# Patient Record
Sex: Male | Born: 1995 | Race: Black or African American | Hispanic: No | Marital: Single | Smoking: Current every day smoker
Health system: Southern US, Community
[De-identification: ages and names within clinical notes are randomized; demographics above are authoritative.]

## PROBLEM LIST (undated history)

## (undated) DIAGNOSIS — E119 Type 2 diabetes mellitus without complications: Secondary | ICD-10-CM

## (undated) HISTORY — PX: WISDOM TOOTH EXTRACTION: SHX21

---
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
## 2015-04-09 ENCOUNTER — Encounter (HOSPITAL_COMMUNITY): Payer: Self-pay | Admitting: Emergency Medicine

## 2015-04-09 ENCOUNTER — Emergency Department (HOSPITAL_COMMUNITY)
Admission: EM | Admit: 2015-04-09 | Discharge: 2015-04-09 | Disposition: A | Discharge status: Home / Self Care | Payer: Medicaid Other | Attending: Emergency Medicine | Admitting: Emergency Medicine

## 2015-04-09 DIAGNOSIS — X19XXXA Contact with other heat and hot substances, initial encounter: Secondary | ICD-10-CM | POA: Insufficient documentation

## 2015-04-09 DIAGNOSIS — Y93G9 Activity, other involving cooking and grilling: Secondary | ICD-10-CM | POA: Insufficient documentation

## 2015-04-09 DIAGNOSIS — Y92009 Unspecified place in unspecified non-institutional (private) residence as the place of occurrence of the external cause: Secondary | ICD-10-CM | POA: Insufficient documentation

## 2015-04-09 DIAGNOSIS — Y998 Other external cause status: Secondary | ICD-10-CM | POA: Insufficient documentation

## 2015-04-09 DIAGNOSIS — Z720 Tobacco use: Secondary | ICD-10-CM | POA: Insufficient documentation

## 2015-04-09 DIAGNOSIS — T300 Burn of unspecified body region, unspecified degree: Secondary | ICD-10-CM

## 2015-04-09 DIAGNOSIS — T22232A Burn of second degree of left upper arm, initial encounter: Secondary | ICD-10-CM | POA: Insufficient documentation

## 2015-04-09 MED ORDER — BACITRACIN ZINC 500 UNIT/GM EX OINT: 1.0000 "application " | TOPICAL_OINTMENT | Freq: Two times a day (BID) | CUTANEOUS | Status: DC

## 2015-04-09 MED ORDER — DOUBLE ANTIBIOTIC 500-10000 UNIT/GM EX OINT
TOPICAL_OINTMENT | Freq: Two times a day (BID) | CUTANEOUS | Status: DC
  Filled 2015-04-09 (×16): qty 1

## 2015-04-09 NOTE — Discharge Instructions (Signed)
Burn Care °Your skin is a natural barrier to infection. It is the largest organ of your body. Burns damage this natural protection. To help prevent infection, it is very important to follow your caregiver's instructions in the care of your burn. °Burns are classified as: °· First degree. There is only redness of the skin (erythema). No scarring is expected. °· Second degree. There is blistering of the skin. Scarring may occur with deeper burns. °· Third degree. All layers of the skin are injured, and scarring is expected. °HOME CARE INSTRUCTIONS  °· Wash your hands well before changing your bandage. °· Change your bandage as often as directed by your caregiver. °¨ Remove the old bandage. If the bandage sticks, you may soak it off with cool, clean water. °¨ Cleanse the burn thoroughly but gently with mild soap and water. °¨ Pat the area dry with a clean, dry cloth. °¨ Apply a thin layer of antibacterial cream to the burn. °¨ Apply a clean bandage as instructed by your caregiver. °¨ Keep the bandage as clean and dry as possible. °· Elevate the affected area for the first 24 hours, then as instructed by your caregiver. °· Only take over-the-counter or prescription medicines for pain, discomfort, or fever as directed by your caregiver. °SEEK IMMEDIATE MEDICAL CARE IF:  °· You develop excessive pain. °· You develop redness, tenderness, swelling, or red streaks near the burn. °· The burned area develops yellowish-white fluid (pus) or a bad smell. °· You have a fever. °MAKE SURE YOU:  °· Understand these instructions. °· Will watch your condition. °· Will get help right away if you are not doing well or get worse. °  °This information is not intended to replace advice given to you by your health care provider. Make sure you discuss any questions you have with your health care provider. °  °Document Released: 05/31/2005 Document Revised: 08/23/2011 Document Reviewed: 10/21/2010 °Elsevier Interactive Patient Education ©2016  [employer] ° °
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
## 2017-10-21 ENCOUNTER — Other Ambulatory Visit: Payer: Self-pay

## 2017-10-21 ENCOUNTER — Emergency Department (HOSPITAL_COMMUNITY): Payer: Self-pay

## 2017-10-21 ENCOUNTER — Encounter (HOSPITAL_COMMUNITY): Payer: Self-pay | Admitting: *Deleted

## 2017-10-21 ENCOUNTER — Emergency Department (HOSPITAL_COMMUNITY)
Admission: EM | Admit: 2017-10-21 | Discharge: 2017-10-21 | Disposition: A | Discharge status: Home / Self Care | Payer: Self-pay | Attending: Emergency Medicine | Admitting: Emergency Medicine

## 2017-10-21 DIAGNOSIS — Z23 Encounter for immunization: Secondary | ICD-10-CM | POA: Insufficient documentation

## 2017-10-21 DIAGNOSIS — F17290 Nicotine dependence, other tobacco product, uncomplicated: Secondary | ICD-10-CM | POA: Insufficient documentation

## 2017-10-21 DIAGNOSIS — S61212A Laceration without foreign body of right middle finger without damage to nail, initial encounter: Secondary | ICD-10-CM | POA: Insufficient documentation

## 2017-10-21 DIAGNOSIS — Y939 Activity, unspecified: Secondary | ICD-10-CM | POA: Insufficient documentation

## 2017-10-21 DIAGNOSIS — Y999 Unspecified external cause status: Secondary | ICD-10-CM | POA: Insufficient documentation

## 2017-10-21 DIAGNOSIS — Y929 Unspecified place or not applicable: Secondary | ICD-10-CM | POA: Insufficient documentation

## 2017-10-21 DIAGNOSIS — W25XXXA Contact with sharp glass, initial encounter: Secondary | ICD-10-CM | POA: Insufficient documentation

## 2017-10-21 MED ORDER — LIDOCAINE HCL (PF) 1 % IJ SOLN
5.0000 mL | Freq: Once | INTRAMUSCULAR | Status: AC
  Administered 2017-10-21: 5 mL
  Filled 2017-10-21: qty 5

## 2017-10-21 MED ORDER — STERILE WATER FOR IRRIGATION IR SOLN
Freq: Once | Status: AC
  Administered 2017-10-21: 1000 mL

## 2017-10-21 MED ORDER — TETANUS-DIPHTH-ACELL PERTUSSIS 5-2.5-18.5 LF-MCG/0.5 IM SUSP
0.5000 mL | Freq: Once | INTRAMUSCULAR | Status: AC
  Administered 2017-10-21: 0.5 mL via INTRAMUSCULAR
  Filled 2017-10-21: qty 0.5
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
## 2017-10-21 NOTE — ED Notes (Signed)
Patient transported to X-ray 

## 2017-10-21 NOTE — Discharge Instructions (Signed)
Your laceration was repaired with 3 sutures today. You were given a tetanus shot. Your x-rays were negative for any bony injuries or opaque foreign bodies.   Your sutures need to come out within 7-10 days.   The original dressing should be left in place for 24 hours.  If your laceration is small enough, you can remove original dressing after 24 hours after which laceration can be opened to air. Laceration can then be gently cleaned with mild soap and water after 24 hours of laceration repair to prevent crusting over the suture knots. An antibiotic ointment can be applied to the wound as well, twice daily until suture removal.   Your sutures are nonabsorbable sutures and you may shower or wash the wound with soap and water without risking increased rates of infection or disruption of the wound.  Although not well studied, prolonged soaking of nonabsorbable stitches including swimming in chlorinated water should be avoided because of the theoretical risk of premature loss of suture tensile strength with wound dehiscence. Patients with sutures should also not swim in natural bodies of water because of a potential increased risk of infection.

## 2018-12-19 ENCOUNTER — Other Ambulatory Visit: Payer: Self-pay

## 2018-12-19 ENCOUNTER — Emergency Department (HOSPITAL_COMMUNITY)
Admission: EM | Admit: 2018-12-19 | Discharge: 2018-12-19 | Disposition: A | Discharge status: Home / Self Care | Payer: Self-pay | Attending: Emergency Medicine | Admitting: Emergency Medicine

## 2018-12-19 ENCOUNTER — Encounter (HOSPITAL_COMMUNITY): Payer: Self-pay | Admitting: Emergency Medicine

## 2018-12-19 DIAGNOSIS — F17210 Nicotine dependence, cigarettes, uncomplicated: Secondary | ICD-10-CM | POA: Insufficient documentation

## 2018-12-19 DIAGNOSIS — Y939 Activity, unspecified: Secondary | ICD-10-CM | POA: Insufficient documentation

## 2018-12-19 DIAGNOSIS — S058X1A Other injuries of right eye and orbit, initial encounter: Secondary | ICD-10-CM | POA: Insufficient documentation

## 2018-12-19 DIAGNOSIS — W25XXXA Contact with sharp glass, initial encounter: Secondary | ICD-10-CM | POA: Insufficient documentation

## 2018-12-19 DIAGNOSIS — T1591XA Foreign body on external eye, part unspecified, right eye, initial encounter: Secondary | ICD-10-CM | POA: Insufficient documentation

## 2018-12-19 DIAGNOSIS — S0502XA Injury of conjunctiva and corneal abrasion without foreign body, left eye, initial encounter: Secondary | ICD-10-CM

## 2018-12-19 DIAGNOSIS — Y999 Unspecified external cause status: Secondary | ICD-10-CM | POA: Insufficient documentation

## 2018-12-19 DIAGNOSIS — Y929 Unspecified place or not applicable: Secondary | ICD-10-CM | POA: Insufficient documentation

## 2018-12-19 MED ORDER — TETRACAINE HCL 0.5 % OP SOLN
1.0000 [drp] | Freq: Once | OPHTHALMIC | Status: AC
  Administered 2018-12-19: 2 [drp] via OPHTHALMIC
  Filled 2018-12-19: qty 4

## 2018-12-19 MED ORDER — KETOROLAC TROMETHAMINE 30 MG/ML IJ SOLN
30.0000 mg | Freq: Once | INTRAMUSCULAR | Status: AC
  Administered 2018-12-19: 30 mg via INTRAMUSCULAR
  Filled 2018-12-19: qty 1

## 2018-12-19 MED ORDER — OXYCODONE-ACETAMINOPHEN 5-325 MG PO TABS
1.0000 | ORAL_TABLET | Freq: Once | ORAL | Status: AC
  Administered 2018-12-19: 1 via ORAL
  Filled 2018-12-19: qty 1

## 2018-12-19 MED ORDER — METOCLOPRAMIDE HCL 10 MG PO TABS
5.0000 mg | ORAL_TABLET | Freq: Once | ORAL | Status: AC
  Administered 2018-12-19: 5 mg via ORAL
  Filled 2018-12-19: qty 1

## 2018-12-19 MED ORDER — FLUORESCEIN SODIUM 1 MG OP STRP
1.0000 | ORAL_STRIP | Freq: Once | OPHTHALMIC | Status: AC
  Administered 2018-12-19: 1 via OPHTHALMIC
  Filled 2018-12-19: qty 1

## 2018-12-19 MED ORDER — ERYTHROMYCIN 5 MG/GM OP OINT
1.0000 "application " | TOPICAL_OINTMENT | Freq: Four times a day (QID) | OPHTHALMIC | Status: DC
  Filled 2018-12-19: qty 3.5
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
## 2018-12-19 NOTE — Discharge Instructions (Signed)
Please read attached information. If you experience any new or worsening signs or symptoms please return to the emergency room for evaluation. Please follow-up with your primary care provider or specialist as discussed. Please use medication prescribed only as directed and discontinue taking if you have any concerning signs or symptoms.   °
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
## 2018-12-25 ENCOUNTER — Other Ambulatory Visit: Payer: Self-pay

## 2018-12-25 ENCOUNTER — Encounter (HOSPITAL_COMMUNITY): Payer: Self-pay | Admitting: *Deleted

## 2018-12-25 NOTE — Progress Notes (Signed)
SDW-pre-op call completed by pt mother, Roger Silva, with pt verbal consent. Mother denies that pt C/O SOB and chest pain. Mother denies that pt is under the care of a cardiologist. Mother denies that pt had an echo, stress test and cardiac cath. Mother denies that pt had an EKG and chest x ray within the last year. Mother denies recent labs. Mother made aware to have pt stop taking Aspirin (unless otherwise advised by surgeon), vitamins, fish oil and herbal medications. Do not take any NSAIDs ie: Ibuprofen, Advil, Naproxen (Aleve), Motrin, BC and Goody Powder. Mother denies that pt and family members tested positive for COVID-19 ( pt agreed to go to [facility] prior to arrival  for surgery for COVID-19 test ).   Coronavirus Screening  Mother denies that pt and family members experienced the following symptoms:  Cough yes/no: No Fever (>100.63F)  yes/no: No Runny nose yes/no: No Sore throat yes/no: No Difficulty breathing/shortness of breath  yes/no: No  Have you or a family member traveled in the last 14 days and where? yes/no: No Mother reminded that hospital visitation restrictions are in effect and the importance of the restrictions.  Mother verbalized understanding of all pre-op instructions.

## 2018-12-26 ENCOUNTER — Ambulatory Visit (HOSPITAL_COMMUNITY): Payer: Self-pay | Admitting: Certified Registered"

## 2018-12-26 ENCOUNTER — Encounter (HOSPITAL_COMMUNITY): Payer: Self-pay | Admitting: *Deleted

## 2018-12-26 ENCOUNTER — Other Ambulatory Visit (HOSPITAL_COMMUNITY)
Admission: RE | Admit: 2018-12-26 | Discharge: 2018-12-26 | Disposition: A | Discharge status: Home / Self Care | Payer: Self-pay | Source: Ambulatory Visit | Attending: Ophthalmology | Admitting: Ophthalmology

## 2018-12-26 ENCOUNTER — Encounter (HOSPITAL_COMMUNITY)
Admission: RE | Disposition: A | Discharge status: Home / Self Care | Payer: Self-pay | Source: Home / Self Care | Attending: Ophthalmology

## 2018-12-26 ENCOUNTER — Ambulatory Visit (HOSPITAL_COMMUNITY)
Admission: RE | Admit: 2018-12-26 | Discharge: 2018-12-26 | Disposition: A | Discharge status: Home / Self Care | Payer: Self-pay | Attending: Ophthalmology | Admitting: Ophthalmology

## 2018-12-26 DIAGNOSIS — T1502XA Foreign body in cornea, left eye, initial encounter: Secondary | ICD-10-CM | POA: Insufficient documentation

## 2018-12-26 DIAGNOSIS — F17200 Nicotine dependence, unspecified, uncomplicated: Secondary | ICD-10-CM | POA: Insufficient documentation

## 2018-12-26 DIAGNOSIS — T1511XA Foreign body in conjunctival sac, right eye, initial encounter: Secondary | ICD-10-CM | POA: Insufficient documentation

## 2018-12-26 DIAGNOSIS — S0552XA Penetrating wound with foreign body of left eyeball, initial encounter: Secondary | ICD-10-CM | POA: Insufficient documentation

## 2018-12-26 DIAGNOSIS — Z20828 Contact with and (suspected) exposure to other viral communicable diseases: Secondary | ICD-10-CM | POA: Insufficient documentation

## 2018-12-26 DIAGNOSIS — T1512XA Foreign body in conjunctival sac, left eye, initial encounter: Secondary | ICD-10-CM | POA: Insufficient documentation

## 2018-12-26 DIAGNOSIS — X58XXXA Exposure to other specified factors, initial encounter: Secondary | ICD-10-CM | POA: Insufficient documentation

## 2018-12-26 HISTORY — PX: RUPTURED GLOBE EXPLORATION AND REPAIR: SHX2366

## 2018-12-26 LAB — SARS CORONAVIRUS 2 BY RT PCR (HOSPITAL ORDER, PERFORMED IN ~~LOC~~ HOSPITAL LAB): SARS Coronavirus 2: NEGATIVE

## 2018-12-26 SURGERY — REPAIR, RUPTURE, GLOBE
Anesthesia: General | Site: Eye | Laterality: Bilateral

## 2018-12-26 MED ORDER — ROCURONIUM 10MG/ML (10ML) SYRINGE FOR MEDFUSION PUMP - OPTIME
INTRAVENOUS | Status: DC | PRN
  Administered 2018-12-26: 40 mg via INTRAVENOUS

## 2018-12-26 MED ORDER — FENTANYL CITRATE (PF) 100 MCG/2ML IJ SOLN
INTRAMUSCULAR | Status: AC
  Filled 2018-12-26: qty 2

## 2018-12-26 MED ORDER — TROPICAMIDE 1 % OP SOLN
1.0000 [drp] | OPHTHALMIC | Status: AC | PRN
  Administered 2018-12-26 (×3): 1 [drp] via OPHTHALMIC
  Filled 2018-12-26: qty 15

## 2018-12-26 MED ORDER — DEXAMETHASONE SODIUM PHOSPHATE 10 MG/ML IJ SOLN
INTRAMUSCULAR | Status: AC
  Filled 2018-12-26: qty 1

## 2018-12-26 MED ORDER — LIDOCAINE 2% (20 MG/ML) 5 ML SYRINGE
INTRAMUSCULAR | Status: AC
  Filled 2018-12-26: qty 5

## 2018-12-26 MED ORDER — PROPOFOL 10 MG/ML IV BOLUS
INTRAVENOUS | Status: DC | PRN
  Administered 2018-12-26: 170 mg via INTRAVENOUS

## 2018-12-26 MED ORDER — OFLOXACIN 0.3 % OP SOLN
1.0000 [drp] | OPHTHALMIC | Status: AC | PRN
  Administered 2018-12-26 (×3): 1 [drp] via OPHTHALMIC
  Filled 2018-12-26: qty 5

## 2018-12-26 MED ORDER — LIDOCAINE HCL (CARDIAC) PF 100 MG/5ML IV SOSY
PREFILLED_SYRINGE | INTRAVENOUS | Status: DC | PRN
  Administered 2018-12-26: 50 mg via INTRATRACHEAL

## 2018-12-26 MED ORDER — CEFAZOLIN SUBCONJUNCTIVAL INJECTION 100 MG/0.5 ML
100.0000 mg | INJECTION | SUBCONJUNCTIVAL | Status: DC
  Filled 2018-12-26: qty 5

## 2018-12-26 MED ORDER — SODIUM HYALURONATE 10 MG/ML IO SOLN
INTRAOCULAR | Status: AC
  Filled 2018-12-26: qty 0.85

## 2018-12-26 MED ORDER — FENTANYL CITRATE (PF) 250 MCG/5ML IJ SOLN
INTRAMUSCULAR | Status: AC
  Filled 2018-12-26: qty 5

## 2018-12-26 MED ORDER — ROCURONIUM BROMIDE 10 MG/ML (PF) SYRINGE
PREFILLED_SYRINGE | INTRAVENOUS | Status: AC
  Filled 2018-12-26: qty 10

## 2018-12-26 MED ORDER — DEXAMETHASONE SODIUM PHOSPHATE 10 MG/ML IJ SOLN
INTRAMUSCULAR | Status: DC | PRN
  Administered 2018-12-26: 10 mg via INTRAVENOUS

## 2018-12-26 MED ORDER — GENTAMICIN SULFATE 40 MG/ML IJ SOLN
INTRAMUSCULAR | Status: AC
  Filled 2018-12-26: qty 2

## 2018-12-26 MED ORDER — TOBRAMYCIN 0.3 % OP OINT
TOPICAL_OINTMENT | OPHTHALMIC | Status: DC | PRN
  Administered 2018-12-26: 1 via OPHTHALMIC

## 2018-12-26 MED ORDER — BSS IO SOLN
INTRAOCULAR | Status: AC
  Filled 2018-12-26: qty 15

## 2018-12-26 MED ORDER — MIDAZOLAM HCL 2 MG/2ML IJ SOLN
INTRAMUSCULAR | Status: DC | PRN
  Administered 2018-12-26: 2 mg via INTRAVENOUS

## 2018-12-26 MED ORDER — TOBRAMYCIN-DEXAMETHASONE 0.3-0.1 % OP OINT
TOPICAL_OINTMENT | OPHTHALMIC | Status: AC
  Filled 2018-12-26: qty 3.5

## 2018-12-26 MED ORDER — BUPIVACAINE HCL (PF) 0.75 % IJ SOLN
INTRAMUSCULAR | Status: AC
  Filled 2018-12-26: qty 30

## 2018-12-26 MED ORDER — OXYCODONE HCL 5 MG/5ML PO SOLN: 5.0000 mg | Freq: Once | ORAL | Status: DC | PRN

## 2018-12-26 MED ORDER — NEOMYCIN-POLYMYXIN-DEXAMETH 3.5-10000-0.1 OP OINT
TOPICAL_OINTMENT | OPHTHALMIC | Status: AC
  Filled 2018-12-26: qty 3.5

## 2018-12-26 MED ORDER — DEXAMETHASONE SODIUM PHOSPHATE 10 MG/ML IJ SOLN
INTRAMUSCULAR | Status: DC | PRN
  Administered 2018-12-26: 10 mg

## 2018-12-26 MED ORDER — SUGAMMADEX SODIUM 200 MG/2ML IV SOLN
INTRAVENOUS | Status: DC | PRN
  Administered 2018-12-26: 200 mg via INTRAVENOUS

## 2018-12-26 MED ORDER — SODIUM HYALURONATE 10 MG/ML IO SOLN
INTRAOCULAR | Status: DC | PRN
  Administered 2018-12-26: 0.85 mL via INTRAOCULAR

## 2018-12-26 MED ORDER — PROPOFOL 10 MG/ML IV BOLUS
INTRAVENOUS | Status: AC
  Filled 2018-12-26: qty 20

## 2018-12-26 MED ORDER — ONDANSETRON HCL 4 MG/2ML IJ SOLN
INTRAMUSCULAR | Status: AC
  Filled 2018-12-26: qty 2

## 2018-12-26 MED ORDER — PHENYLEPHRINE 40 MCG/ML (10ML) SYRINGE FOR IV PUSH (FOR BLOOD PRESSURE SUPPORT)
PREFILLED_SYRINGE | INTRAVENOUS | Status: AC
  Filled 2018-12-26: qty 10

## 2018-12-26 MED ORDER — OXYCODONE HCL 5 MG PO TABS: 5.0000 mg | ORAL_TABLET | Freq: Once | ORAL | Status: DC | PRN

## 2018-12-26 MED ORDER — HYPROMELLOSE (GONIOSCOPIC) 2.5 % OP SOLN
OPHTHALMIC | Status: AC
  Filled 2018-12-26: qty 15

## 2018-12-26 MED ORDER — EPHEDRINE 5 MG/ML INJ
INTRAVENOUS | Status: AC
  Filled 2018-12-26: qty 10

## 2018-12-26 MED ORDER — BACITRACIN-POLYMYXIN B 500-10000 UNIT/GM OP OINT
TOPICAL_OINTMENT | OPHTHALMIC | Status: AC
  Filled 2018-12-26: qty 3.5

## 2018-12-26 MED ORDER — SODIUM CHLORIDE 0.9 % IV SOLN
INTRAVENOUS | Status: DC
  Administered 2018-12-26: 15:00:00 via INTRAVENOUS

## 2018-12-26 MED ORDER — ONDANSETRON HCL 4 MG/2ML IJ SOLN
INTRAMUSCULAR | Status: DC | PRN
  Administered 2018-12-26: 4 mg via INTRAVENOUS

## 2018-12-26 MED ORDER — FENTANYL CITRATE (PF) 100 MCG/2ML IJ SOLN
25.0000 ug | INTRAMUSCULAR | Status: DC | PRN
  Administered 2018-12-26 (×2): 50 ug via INTRAVENOUS

## 2018-12-26 MED ORDER — TETRACAINE HCL 0.5 % OP SOLN
OPHTHALMIC | Status: AC
  Filled 2018-12-26: qty 4

## 2018-12-26 MED ORDER — SUCCINYLCHOLINE 20MG/ML (10ML) SYRINGE FOR MEDFUSION PUMP - OPTIME
INTRAMUSCULAR | Status: DC | PRN
  Administered 2018-12-26: 120 mg via INTRAVENOUS

## 2018-12-26 MED ORDER — PROPARACAINE HCL 0.5 % OP SOLN
1.0000 [drp] | OPHTHALMIC | Status: AC | PRN
  Administered 2018-12-26 (×3): 1 [drp] via OPHTHALMIC
  Filled 2018-12-26: qty 15

## 2018-12-26 MED ORDER — FENTANYL CITRATE (PF) 250 MCG/5ML IJ SOLN
INTRAMUSCULAR | Status: DC | PRN
  Administered 2018-12-26: 75 ug via INTRAVENOUS

## 2018-12-26 MED ORDER — MIDAZOLAM HCL 2 MG/2ML IJ SOLN
INTRAMUSCULAR | Status: AC
  Filled 2018-12-26: qty 2

## 2018-12-26 MED ORDER — BSS IO SOLN
INTRAOCULAR | Status: DC | PRN
  Administered 2018-12-26: 15 mL via INTRAOCULAR

## 2018-12-26 MED ORDER — ONDANSETRON HCL 4 MG/2ML IJ SOLN: 4.0000 mg | Freq: Four times a day (QID) | INTRAMUSCULAR | Status: DC | PRN

## 2018-12-26 SURGICAL SUPPLY — 32 items
APL SRG 3 HI ABS STRL LF PLS (MISCELLANEOUS)
APPLICATOR DR MATTHEWS STRL (MISCELLANEOUS) IMPLANT
BAG ISL DRAPE 18X18 STRL (DRAPES)
BAG ISOLATION DRAPE 18X18 (DRAPES) IMPLANT
BLADE EYE CATARACT 19 1.4 BEAV (BLADE) IMPLANT
BLADE MVR KNIFE 20G (BLADE) IMPLANT
CAUTERY EYE LOW TEMP 1300F FIN (OPHTHALMIC RELATED) IMPLANT
CORD BIPOLAR FORCEPS 12FT (ELECTRODE) ×3 IMPLANT
COVER SURGICAL LIGHT HANDLE (MISCELLANEOUS) ×3 IMPLANT
COVER WAND RF STERILE (DRAPES) ×1 IMPLANT
DRAPE ISOLATION BAG 18X18 (DRAPES)
DRAPE OPHTHALMIC 40X48 W POUCH (DRAPES) ×3 IMPLANT
ERASER HMR WETFIELD 23G BP (MISCELLANEOUS) IMPLANT
GLOVE ECLIPSE 7.5 STRL STRAW (GLOVE) ×3 IMPLANT
GOWN STRL REUS W/ TWL LRG LVL3 (GOWN DISPOSABLE) ×1 IMPLANT
GOWN STRL REUS W/TWL LRG LVL3 (GOWN DISPOSABLE) ×3
KIT BASIN OR (CUSTOM PROCEDURE TRAY) ×3 IMPLANT
LENS BIOM SUPER VIEW SET DISP (MISCELLANEOUS) IMPLANT
NS IRRIG 1000ML POUR BTL (IV SOLUTION) ×3 IMPLANT
PACK CATARACT CUSTOM (CUSTOM PROCEDURE TRAY) ×3 IMPLANT
PAD ARMBOARD 7.5X6 YLW CONV (MISCELLANEOUS) ×6 IMPLANT
ROLLS DENTAL (MISCELLANEOUS) IMPLANT
SHIELD EYE LENSE ONLY DISP (GAUZE/BANDAGES/DRESSINGS) ×2 IMPLANT
SOLUTION ANTI FOG 6CC (MISCELLANEOUS) ×3 IMPLANT
SPEAR EYE SURG WECK-CEL (MISCELLANEOUS) IMPLANT
SPECIMEN JAR SMALL (MISCELLANEOUS) IMPLANT
SUT CHROMIC 7 0 TG140 8 (SUTURE) ×3 IMPLANT
SUT ETHILON 10 0 CS140 6 (SUTURE) ×6 IMPLANT
SUT ETHILON 9 0 TG140 8 (SUTURE) ×3 IMPLANT
SUT VICRYL 8 0 TG140 8 (SUTURE) ×4 IMPLANT
TOWEL GREEN STERILE FF (TOWEL DISPOSABLE) ×6 IMPLANT
WATER STERILE IRR 1000ML POUR (IV SOLUTION) ×3 IMPLANT

## 2018-12-26 NOTE — Anesthesia Postprocedure Evaluation (Signed)
Anesthesia Post Note  Patient: Roger Silva  Procedure(s) Performed: GLOBAL EXPLORATION LEFT EYE, EMBEDDED FOREIGN BODY REMOVAL BILATERAL EYES (Bilateral Eye)     Patient location during evaluation: PACU Anesthesia Type: General Level of consciousness: awake and alert Pain management: pain level controlled Vital Signs Assessment: post-procedure vital signs reviewed and stable Respiratory status: spontaneous breathing, nonlabored ventilation and respiratory function stable Cardiovascular status: blood pressure returned to baseline and stable Postop Assessment: no apparent nausea or vomiting Anesthetic complications: no    Last Vitals:  Vitals:   12/26/18 1453 12/26/18 1850  BP: 123/68 120/66  Pulse: 67 90  Resp: 16 10  Temp: 36.9 C 36.7 C  SpO2: 100% 97%    Last Pain:  Vitals:   12/26/18 1850  TempSrc:   PainSc: 0-No pain                 Audry Pili

## 2018-12-26 NOTE — Anesthesia Preprocedure Evaluation (Addendum)
Anesthesia Evaluation  Patient identified by MRN, date of birth, ID band Patient awake    Reviewed: Allergy & Precautions, H&P , NPO status , Patient's Chart, lab work & pertinent test results  Airway Mallampati: I   Neck ROM: full    Dental   Pulmonary Current Smoker,    breath sounds clear to auscultation       Cardiovascular negative cardio ROS   Rhythm:regular Rate:Normal     Neuro/Psych    GI/Hepatic   Endo/Other    Renal/GU      Musculoskeletal   Abdominal   Peds  Hematology   Anesthesia Other Findings   Reproductive/Obstetrics                             Anesthesia Physical Anesthesia Plan  ASA: II  Anesthesia Plan: General   Post-op Pain Management:    Induction: Intravenous  PONV Risk Score and Plan: 1 and Ondansetron, Dexamethasone, Midazolam and Treatment may vary due to age or medical condition  Airway Management Planned: Oral ETT  Additional Equipment:   Intra-op Plan:   Post-operative Plan: Extubation in OR  Informed Consent: I have reviewed the patients History and Physical, chart, labs and discussed the procedure including the risks, benefits and alternatives for the proposed anesthesia with the patient or authorized representative who has indicated his/her understanding and acceptance.       Plan Discussed with: CRNA, Anesthesiologist and Surgeon  Anesthesia Plan Comments:         Anesthesia Quick Evaluation

## 2018-12-26 NOTE — Brief Op Note (Signed)
12/26/2018  6:42 PM  PATIENT:  Roger Silva  23 y.o. male  PRE-OPERATIVE DIAGNOSIS:  FOREIGN BODY BOTH EYES - embedded conjunctival both eyes, embedded corneal left eye  POST-OPERATIVE DIAGNOSIS: FOREIGN BODY BOTH EYES - embedded conjunctival both eyes, embedded corneal left eye  PROCEDURE:  Procedure(s): GLOBAL EXPLORATION LEFT EYE, EMBEDDED FOREIGN BODY REMOVAL BILATERAL EYES (Bilateral)  SURGEON:  Surgeon(s) and Role:    * Jalene Mullet, MD - Primary  PHYSICIAN ASSISTANT:   ASSISTANTS: none   ANESTHESIA:   general and topical  EBL:  minimal   BLOOD ADMINISTERED:none  DRAINS: none   LOCAL MEDICATIONS USED:  NONE  SPECIMEN:  Source of Specimen:  Both eyes - embedded foreign body - metallic, glass, and hair  DISPOSITION OF SPECIMEN:  PATHOLOGY  COUNTS:  YES  TOURNIQUET:  * No tourniquets in log *  DICTATION: .Note written in EPIC  PLAN OF CARE: Discharge to home after PACU  PATIENT DISPOSITION:  PACU - hemodynamically stable.   Delay start of Pharmacological VTE agent (>24hrs) due to surgical blood loss or risk of bleeding: not applicable

## 2018-12-26 NOTE — Transfer of Care (Signed)
Immediate Anesthesia Transfer of Care Note  Patient: Roger Silva  Procedure(s) Performed: GLOBAL EXPLORATION LEFT EYE, EMBEDDED FOREIGN BODY REMOVAL BILATERAL EYES (Bilateral Eye)  Patient Location: PACU  Anesthesia Type:General  Level of Consciousness: sedated and patient cooperative  Airway & Oxygen Therapy: Patient Spontanous Breathing  Post-op Assessment: Report given to RN and Post -op Vital signs reviewed and stable  Post vital signs: Reviewed and stable  Last Vitals:  Vitals Value Taken Time  BP    Temp    Pulse 89 12/26/18 1850  Resp 13 12/26/18 1851  SpO2 97 % 12/26/18 1850  Vitals shown include unvalidated device data.  Last Pain:  Vitals:   12/26/18 1453  TempSrc: Oral  PainSc: 0-No pain      Patients Stated Pain Goal: 4 (73/41/93 7902)  Complications: No apparent anesthesia complications

## 2018-12-26 NOTE — Discharge Instructions (Addendum)
Use steroid/antibiotic ointment 2-3/day left eye for one week and right eye for 3 days

## 2018-12-26 NOTE — H&P (Signed)
  Date of examination:  12/26/18  Indication for surgery: foreign body to both eyes  Pertinent past medical history: History reviewed. No pertinent past medical history.  Pertinent ocular history: projectile injury to both eyes  Pertinent family history: History reviewed. No pertinent family history.  General:  Healthy appearing patient in no distress.    Eyes:    Acuity OD 20/25  OS 20/50  [place]  External: Within normal limits     Anterior segment: embedded conjunctival metallic FB OU    Fundus: sclopetaria OS and commotio OD    Impression: Projectile injury both eye and embedded foreign body both eyes  Plan: Globe exploration left eye and removal of foreign bodies both eyes  Royston Cowper

## 2018-12-26 NOTE — Anesthesia Procedure Notes (Signed)
Procedure Name: Intubation Date/Time: 12/26/2018 5:42 PM Performed by: Valetta Fuller, CRNA Pre-anesthesia Checklist: Patient identified, Emergency Drugs available, Suction available and Patient being monitored Patient Re-evaluated:Patient Re-evaluated prior to induction Oxygen Delivery Method: Circle system utilized Preoxygenation: Pre-oxygenation with 100% oxygen Induction Type: IV induction, Rapid sequence and Cricoid Pressure applied Laryngoscope Size: Miller and 2 Grade View: Grade II Tube size: 7.5 mm Number of attempts: 1 Airway Equipment and Method: Stylet Secured at: 23 cm Tube secured with: Tape Dental Injury: Teeth and Oropharynx as per pre-operative assessment

## 2018-12-27 ENCOUNTER — Encounter (HOSPITAL_COMMUNITY): Payer: Self-pay | Admitting: Ophthalmology

## 2019-01-30 NOTE — Op Note (Signed)
Roger Silva  12/26/2018 Diagnosis: Foreign body embedded in conjunctiva both eyes and cornea left eye  Procedure: Exam under anesthesia and removal of foreign body both eyes  Operative Eye:  both eyes  Surgeon: Royston Cowper Estimated Blood Loss: minimal Specimens for Pathology:  None Complications: none  General anesthesia was attained.  Time our confirmed the examination and removal of foreign body from both eyes.  Exam under anesthesia was limited to posterior exam and verification of no intraocular foreign body in either eye.  No foreign body was noted in the anterior or posterior chamber of either eye.  In addition, the optic discs, maculae,  And retinal vessels were normal in both eyes.  The periphery of the left eye showed sclopetaria in the temporal periphery corresponding to projectile injury and associated embedded foreign body.  The periphery of the right eye was normal.   The  patient was prepped and draped in the usual fashion for ocular surgery on the  both eyes .  A lid speculum was placed in the left eye.  An eyelash was noted intrastromal OS and was removed with 0.12 and tying forceps.  No intraocular penetration was noted.  Multiple pieces of metal were noted embedded in the conjunctiva and removed with forceps with local cutdown of the conjunctiva.  The conjuctival was locally excised to allow exploration of the penetrating wound through the conjunctiva.  A metallic foreign body was carefully removed using forceps from then temporal conjunctival wound.  The foreign body measured approximately 3 x 3 x 2 mm in dimension with an irregular border.  The specimen was sent to pathology.  The conjunctival opening was irrigated after debridement with weck sponges.  The opening in the conjunctiva was closed with 8-0 vicryl.  The lid speculum was removed from the left eye.  A lid speculum was placed in the right eye and foreign body material was removed from the conjunctiva.  No  sutures were required to close the conjunctiva in the right eye.     All removed pieces of foreign body were sent for specimen to pathology.  The speculum and drapes were removed and the each eye was dressed with Tobradex ophthalmic ointment. An eye shield was placed over the left eye.  The patient was revived from general anesthesia and sent to the post operative recovery area.  The patient tolerated the procedure well and no complications were noted.  Royston Cowper MD
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
## 2019-11-14 ENCOUNTER — Ambulatory Visit (HOSPITAL_COMMUNITY)
Admission: EM | Admit: 2019-11-14 | Discharge: 2019-11-14 | Disposition: A | Discharge status: Home / Self Care | Payer: Self-pay | Attending: Emergency Medicine | Admitting: Emergency Medicine

## 2019-11-14 ENCOUNTER — Encounter (HOSPITAL_COMMUNITY): Payer: Self-pay

## 2019-11-14 DIAGNOSIS — Z20822 Contact with and (suspected) exposure to covid-19: Secondary | ICD-10-CM | POA: Insufficient documentation

## 2019-11-14 DIAGNOSIS — R110 Nausea: Secondary | ICD-10-CM | POA: Insufficient documentation

## 2019-11-14 DIAGNOSIS — F17210 Nicotine dependence, cigarettes, uncomplicated: Secondary | ICD-10-CM | POA: Insufficient documentation

## 2019-11-14 DIAGNOSIS — R10817 Generalized abdominal tenderness: Secondary | ICD-10-CM | POA: Insufficient documentation

## 2019-11-14 DIAGNOSIS — Z202 Contact with and (suspected) exposure to infections with a predominantly sexual mode of transmission: Secondary | ICD-10-CM

## 2019-11-14 LAB — POCT URINALYSIS DIP (DEVICE)
Glucose, UA: NEGATIVE mg/dL
Ketones, ur: NEGATIVE mg/dL
Leukocytes,Ua: NEGATIVE
Nitrite: NEGATIVE
Protein, ur: NEGATIVE mg/dL
Specific Gravity, Urine: >=1.03 (ref 1.005–1.030)
Urobilinogen, UA: 1 mg/dL (ref 0.0–1.0)
pH: 6 (ref 5.0–8.0)

## 2019-11-14 MED ORDER — AZITHROMYCIN 250 MG PO TABS
ORAL_TABLET | ORAL | Status: AC
  Filled 2019-11-14: qty 4

## 2019-11-14 MED ORDER — AZITHROMYCIN 250 MG PO TABS
1000.0000 mg | ORAL_TABLET | Freq: Once | ORAL | Status: AC
  Administered 2019-11-14: 1000 mg via ORAL

## 2019-11-14 MED ORDER — ONDANSETRON 4 MG PO TBDP
8.0000 mg | ORAL_TABLET | Freq: Once | ORAL | Status: AC
  Administered 2019-11-14: 8 mg via ORAL

## 2019-11-14 MED ORDER — ONDANSETRON 4 MG PO TBDP
ORAL_TABLET | ORAL | Status: AC
  Filled 2019-11-14: qty 2

## 2019-11-14 MED ORDER — CEFTRIAXONE SODIUM 500 MG IJ SOLR
500.0000 mg | Freq: Once | INTRAMUSCULAR | Status: AC
  Administered 2019-11-14: 500 mg via INTRAMUSCULAR

## 2019-11-14 MED ORDER — METRONIDAZOLE 500 MG PO TABS
2000.0000 mg | ORAL_TABLET | Freq: Once | ORAL | Status: AC
  Administered 2019-11-14: 2000 mg via ORAL

## 2019-11-14 MED ORDER — CEFTRIAXONE SODIUM 500 MG IJ SOLR
INTRAMUSCULAR | Status: AC
  Filled 2019-11-14: qty 500

## 2019-11-14 MED ORDER — LIDOCAINE HCL (PF) 1 % IJ SOLN
INTRAMUSCULAR | Status: AC
  Filled 2019-11-14: qty 2

## 2019-11-14 MED ORDER — METRONIDAZOLE 500 MG PO TABS
ORAL_TABLET | ORAL | Status: AC
  Filled 2019-11-14: qty 4

## 2019-11-14 NOTE — ED Triage Notes (Signed)
Pt c/o N/V/Dx2 wks. Pt states he vomited 2 times during last 2 wks. Pt has had 9 bouts of diarrhea over last 2 wks. Pt wants tested for STI.

## 2019-11-14 NOTE — Discharge Instructions (Addendum)
Avoid intercourse until results return. You can check your my chart for results Use protection while having intercourse
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
## 2019-11-15 LAB — CYTOLOGY, (ORAL, ANAL, URETHRAL) ANCILLARY ONLY
Chlamydia: NEGATIVE
Comment: NEGATIVE
Comment: NEGATIVE
Comment: NORMAL
Neisseria Gonorrhea: NEGATIVE
Trichomonas: NEGATIVE

## 2019-11-15 LAB — SARS CORONAVIRUS 2 (TAT 6-24 HRS): SARS Coronavirus 2: NEGATIVE

## 2020-12-22 ENCOUNTER — Encounter (HOSPITAL_COMMUNITY): Payer: Self-pay

## 2020-12-22 ENCOUNTER — Ambulatory Visit (HOSPITAL_COMMUNITY)
Admission: EM | Admit: 2020-12-22 | Discharge: 2020-12-22 | Disposition: A | Discharge status: Home / Self Care | Payer: Self-pay | Attending: Student | Admitting: Student

## 2020-12-22 DIAGNOSIS — R110 Nausea: Secondary | ICD-10-CM

## 2020-12-22 DIAGNOSIS — Z0289 Encounter for other administrative examinations: Secondary | ICD-10-CM

## 2020-12-22 HISTORY — DX: Type 2 diabetes mellitus without complications: E11.9

## 2020-12-22 MED ORDER — ONDANSETRON 8 MG PO TBDP: 8.0000 mg | ORAL_TABLET | Freq: Three times a day (TID) | ORAL | 0 refills | Status: AC | PRN

## 2020-12-22 NOTE — Discharge Instructions (Addendum)
-  Take the Zofran (ondansetron) up to 3 times daily for nausea and vomiting. Dissolve one pill under your tongue or between your teeth and your cheek. -Drink plenty of fluids like water and gatorade while you're at work -Sit down and take deep breaths if you become nauseous and/or lightheaded at work

## 2020-12-22 NOTE — ED Provider Notes (Signed)
[facility]    CSN: [id number] Arrival date & time: 12/22/20  1615      History   Chief Complaint Chief Complaint  Patient presents with   Nausea   Emesis    HPI Roger Silva is a 25 y.o. male presenting with nausea and for work note.  Medical history noncontributory.  Notes few days of nausea in the morning while working in a [employer].  States that this resolves when he points a fan at himself and sits down and drinks water.  Today with some nausea in the morning but no vomiting.  Denies any symptoms currently, denies nausea, vomiting, diarrhea, abdominal pain, fevers or chills, urinary symptoms.  HPI  Past Medical History:  Diagnosis Date   Diabetes mellitus without complication (HCC)     There are no problems to display for this patient.   History reviewed. No pertinent surgical history.     Home Medications    Prior to Admission medications   Medication Sig Start Date End Date Taking? Authorizing Provider  ondansetron (ZOFRAN ODT) 8 MG disintegrating tablet Take 1 tablet (8 mg total) by mouth every 8 (eight) hours as needed for nausea or vomiting. 12/22/20  Yes Rhys Martini, PA-C    Family History History reviewed. No pertinent family history.  Social History Social History   Tobacco Use   Smoking status: Every Day    Pack years: 0.00    Types: Cigarettes   Smokeless tobacco: Never  Substance Use Topics   Alcohol use: Yes   Drug use: Never     Allergies   Patient has no known allergies.   Review of Systems Review of Systems  Constitutional:  Negative for appetite change, chills, diaphoresis, fever and unexpected weight change.  HENT:  Negative for congestion, ear pain, sinus pressure, sinus pain, sneezing, sore throat and trouble swallowing.   Respiratory:  Negative for cough, chest tightness and shortness of breath.   Cardiovascular:  Negative for chest pain.  Gastrointestinal:  Negative for abdominal distention,  abdominal pain, anal bleeding, blood in stool, constipation, diarrhea, nausea, rectal pain and vomiting.  Genitourinary:  Negative for dysuria, flank pain, frequency and urgency.  Musculoskeletal:  Negative for back pain and myalgias.  Neurological:  Negative for dizziness, light-headedness and headaches.  All other systems reviewed and are negative.   Physical Exam Triage Vital Signs ED Triage Vitals  Enc Vitals Group     BP      Pulse      Resp      Temp      Temp src      SpO2      Weight      Height      Head Circumference      Peak Flow      Pain Score      Pain Loc      Pain Edu?      Excl. in GC?    No data found.  Updated Vital Signs BP 123/73 (BP Location: Right Arm)   Pulse 65   Temp 98 F (36.7 C) (Oral)   Resp 16   SpO2 97%   Visual Acuity Right Eye Distance:   Left Eye Distance:   Bilateral Distance:    Right Eye Near:   Left Eye Near:    Bilateral Near:     Physical Exam Vitals reviewed.  Constitutional:      General: He is not in acute distress.    Appearance:  Normal appearance. He is not ill-appearing.  HENT:     Head: Normocephalic and atraumatic.     Mouth/Throat:     Mouth: Mucous membranes are moist.     Comments: Moist mucous membranes Eyes:     Extraocular Movements: Extraocular movements intact.     Pupils: Pupils are equal, round, and reactive to light.  Cardiovascular:     Rate and Rhythm: Normal rate and regular rhythm.     Heart sounds: Normal heart sounds.  Pulmonary:     Effort: Pulmonary effort is normal.     Breath sounds: Normal breath sounds. No wheezing, rhonchi or rales.  Abdominal:     General: Bowel sounds are normal. There is no distension.     Palpations: Abdomen is soft. There is no mass.     Tenderness: There is no abdominal tenderness. There is no right CVA tenderness, left CVA tenderness, guarding or rebound.  Skin:    General: Skin is warm.     Capillary Refill: Capillary refill takes less than 2  seconds.     Comments: Good skin turgor  Neurological:     General: No focal deficit present.     Mental Status: He is alert and oriented to person, place, and time.  Psychiatric:        Mood and Affect: Mood normal.        Behavior: Behavior normal.     UC Treatments / Results  Labs (all labs ordered are listed, but only abnormal results are displayed) Labs Reviewed - No data to display  EKG   Radiology No results found.  Procedures Procedures (including critical care time)  Medications Ordered in UC Medications - No data to display  Initial Impression / Assessment and Plan / UC Course  I have reviewed the triage vital signs and the nursing notes.  Pertinent labs & imaging results that were available during my care of the patient were reviewed by me and considered in my medical decision making (see chart for details).     This patient is a very pleasant 25 y.o. year old male presenting with for work note.  He has had some nausea recently when working in [employer], he has no symptoms currently.  Reassuring exam, appears well-hydrated.  Work note provided, Zofran ODT sent.  Rec good hydration and minimizing hot working conditions of possible. ED return precautions discussed. Patient verbalizes understanding and agreement.    Final Clinical Impressions(s) / UC Diagnoses   Final diagnoses:  Nausea without vomiting  Encounter to obtain excuse from work     Discharge Instructions      -Take the Zofran (ondansetron) up to 3 times daily for nausea and vomiting. Dissolve one pill under your tongue or between your teeth and your cheek. -Drink plenty of fluids like water and gatorade while you're at work -Sit down and take deep breaths if you become nauseous and/or lightheaded at work     ED Prescriptions     Medication Sig Dispense Auth. Provider   ondansetron (ZOFRAN ODT) 8 MG disintegrating tablet Take 1 tablet (8 mg total) by mouth every 8 (eight) hours as  needed for nausea or vomiting. 20 tablet Rhys Martini, PA-C      PDMP not reviewed this encounter.   Rhys Martini, PA-C 12/22/20 1910

## 2020-12-22 NOTE — ED Triage Notes (Signed)
Pt in with c/o nausea and vomiting this morning states he thinks he's having morning sickness due to his girlfriend being pregnant  Requesting work note for today

## 2020-12-24 ENCOUNTER — Encounter (HOSPITAL_COMMUNITY): Payer: Self-pay
